# Patient Record
Sex: Female | Born: 1990 | Race: White | Hispanic: No | Marital: Single | Smoking: Never smoker
Health system: Southern US, Community
[De-identification: ages and names within clinical notes are randomized; demographics above are authoritative.]

## PROBLEM LIST (undated history)

## (undated) ENCOUNTER — Inpatient Hospital Stay (HOSPITAL_COMMUNITY): Payer: Self-pay

## (undated) DIAGNOSIS — Z789 Other specified health status: Secondary | ICD-10-CM

## (undated) HISTORY — PX: CHOLECYSTECTOMY: SHX55

## (undated) SURGERY — Surgical Case
Anesthesia: *Unknown

---

## 2011-07-17 ENCOUNTER — Encounter (HOSPITAL_COMMUNITY): Payer: Self-pay | Admitting: *Deleted

## 2011-07-17 ENCOUNTER — Inpatient Hospital Stay (HOSPITAL_COMMUNITY): Payer: Medicaid Other

## 2011-07-17 ENCOUNTER — Inpatient Hospital Stay (HOSPITAL_COMMUNITY)
Admission: AD | Admit: 2011-07-17 | Discharge: 2011-07-17 | Disposition: A | Discharge status: Home / Self Care | Payer: Medicaid Other | Source: Ambulatory Visit | Attending: Obstetrics & Gynecology | Admitting: Obstetrics & Gynecology

## 2011-07-17 DIAGNOSIS — N390 Urinary tract infection, site not specified: Secondary | ICD-10-CM | POA: Insufficient documentation

## 2011-07-17 DIAGNOSIS — R109 Unspecified abdominal pain: Secondary | ICD-10-CM | POA: Insufficient documentation

## 2011-07-17 DIAGNOSIS — Z30431 Encounter for routine checking of intrauterine contraceptive device: Secondary | ICD-10-CM | POA: Insufficient documentation

## 2011-07-17 LAB — WET PREP, GENITAL: Yeast Wet Prep HPF POC: NONE SEEN

## 2011-07-17 LAB — URINALYSIS, ROUTINE W REFLEX MICROSCOPIC
Nitrite: POSITIVE — AB
Specific Gravity, Urine: 1.015 (ref 1.005–1.030)
pH: 6.5 (ref 5.0–8.0)

## 2011-07-17 LAB — URINE MICROSCOPIC-ADD ON

## 2011-07-17 MED ORDER — KETOROLAC TROMETHAMINE 60 MG/2ML IM SOLN
60.0000 mg | Freq: Once | INTRAMUSCULAR | Status: AC
  Administered 2011-07-17: 60 mg via INTRAMUSCULAR
  Filled 2011-07-17: qty 2

## 2011-07-17 MED ORDER — SULFAMETHOXAZOLE-TRIMETHOPRIM 800-160 MG PO TABS: 1.0000 | ORAL_TABLET | Freq: Two times a day (BID) | ORAL | Status: AC

## 2011-07-17 NOTE — Progress Notes (Signed)
Patient states she had an IUD placed after the birth of her daughter 2 years ago. Patient states that with intercourse 4 days ago had sharp pain and bleeding for one day and the pain has continued. Patient states she went to [facility] 2 days ago and wanted the IUD removed and was informed by them they could not remove it. Pain continues and has had a little spotting. Is requesting to have the IUD removed. Has never been able to feel the string.

## 2011-07-17 NOTE — ED Provider Notes (Signed)
History     Chief Complaint  Patient presents with  . Pelvic Pain   HPI Rachel Chung 20 y.o. comes in with abdominal pain which started 4 days ago after sex.  Thinks it is her IUD causing the pain and wants it removed.  Went to [facility] yesterday and had blood work and an ultrasound.  She was referred to [facility] for follow up but she could not afford the up front office fees to be seen.  So came to [facility].  Mirena IUD inserted 2 years ago in Dr. Paralee Cancel office postpartum.   OB History    Grav Para Term Preterm Abortions TAB SAB Ect Mult Living   1 1 1  0 0 0 0 0 0 1      History reviewed. No pertinent past medical history.  Past Surgical History  Procedure Date  . Cesarean section     No family history on file.  History  Substance Use Topics  . Smoking status: Never Smoker   . Smokeless tobacco: Never Used  . Alcohol Use: Yes     x 4 a year    Allergies: Allergies not on file  No prescriptions prior to admission    Review of Systems  Gastrointestinal: Positive for abdominal pain.   Physical Exam   Blood pressure 108/74, pulse 77, temperature 99.3 F (37.4 C), temperature source Oral, resp. rate 16, height 5\' 2"  (1.575 m), weight 193 lb (87.544 kg), SpO2 99.00%.  Physical Exam  Nursing note and vitals reviewed. Constitutional: She is oriented to person, place, and time. She appears well-developed and well-nourished.  HENT:  Head: Normocephalic.  Eyes: EOM are normal.  Neck: Neck supple.  GI: Soft. There is tenderness. There is no rebound and no guarding.       Pain in low midline with palpation Client reports pain radiates to hip bones when it is bad.  Genitourinary:       Speculum exam: Vulva - negative Vagina - Small amount of gold colored discharge, no odor Cervix - No contact bleeding Bimanual exam: Cervix closed, no IUD strings visualized Uterus non tender, normal size Adnexa non tender, no masses bilaterally GC/Chlam, wet prep  done Chaperone present for exam.  Musculoskeletal: Normal range of motion.  Neurological: She is alert and oriented to person, place, and time.  Skin: Skin is warm and dry.  Psychiatric: She has a normal mood and affect.    MAU Course  Procedures Results for orders placed during the hospital encounter of 07/17/11 (from the past 24 hour(s))  URINALYSIS, ROUTINE W REFLEX MICROSCOPIC     Status: Abnormal   Collection Time   07/17/11  6:30 PM      Component Value Range   Color, Urine YELLOW  YELLOW    Appearance CLEAR  CLEAR    Specific Gravity, Urine 1.015  1.005 - 1.030    pH 6.5  5.0 - 8.0    Glucose, UA NEGATIVE  NEGATIVE (mg/dL)   Hgb urine dipstick SMALL (*) NEGATIVE    Bilirubin Urine NEGATIVE  NEGATIVE    Ketones, ur NEGATIVE  NEGATIVE (mg/dL)   Protein, ur NEGATIVE  NEGATIVE (mg/dL)   Urobilinogen, UA 0.2  0.0 - 1.0 (mg/dL)   Nitrite POSITIVE (*) NEGATIVE    Leukocytes, UA SMALL (*) NEGATIVE   URINE MICROSCOPIC-ADD ON     Status: Abnormal   Collection Time   07/17/11  6:30 PM      Component Value Range  Squamous Epithelial / LPF FEW (*) RARE    WBC, UA 21-50  <3 (WBC/hpf)   RBC / HPF 3-6  <3 (RBC/hpf)   Bacteria, UA MANY (*) RARE    Urine-Other MUCOUS PRESENT    WET PREP, GENITAL     Status: Abnormal   Collection Time   07/17/11  6:50 PM      Component Value Range   Yeast, Wet Prep NONE SEEN  NONE SEEN    Trich, Wet Prep NONE SEEN  NONE SEEN    Clue Cells, Wet Prep NONE SEEN  NONE SEEN    WBC, Wet Prep HPF POC FEW (*) NONE SEEN   POCT PREGNANCY, URINE     Status: Normal   Collection Time   07/17/11  7:38 PM      Component Value Range   Preg Test, Ur NEGATIVE      MDM Ultrasound Clinical Data: Evaluate IUD placement.  TRANSABDOMINAL AND TRANSVAGINAL ULTRASOUND OF PELVIS  Technique: Both transabdominal and transvaginal ultrasound  examinations of the pelvis were performed. Transabdominal technique  was performed for global imaging of the pelvis including  uterus,  ovaries, adnexal regions, and pelvic cul-de-sac.  Comparison: None.  It was necessary to proceed with endovaginal exam following the  transabdominal exam to visualize the ovaries and endometrium.  Findings:  Uterus: Measures 8.2 x 3.3 x 4.4 cm. Normal appearance of the  myometrium. No fibroids.  Endometrium: Normal in thickness measuring 4.2 mm. The IUD is in  the endometrial canal.  Right ovary: Measures 3.1 x 2.1 x 2.1 cm. No cysts or masses.  Left ovary: Measures 2.5 x 1.7 x 1.1 cm. No cysts or masses.  Other findings: No free fluid  IMPRESSION:  1. Normal sonographic appearance of the uterus and ovaries.  2. The IUD is in the endometrial canal.  Called and talked with ultrasound tech who confirmed IUD is in correct place in uterus.  Assessment and Plan  UTI  Plan: Will treat with Septra Advised to drink 8 glasses of water daily Advised to get prescription filled and take all as directed Return with worsening symptoms Be seen in the GYN clinic if you want the IUD removed.  BURLESON,TERRI 07/17/2011, 6:53 PM   Nolene Bernheim, NP 07/17/11 1959

## 2011-07-18 LAB — GC/CHLAMYDIA PROBE AMP, GENITAL: Chlamydia, DNA Probe: NEGATIVE

## 2011-08-21 ENCOUNTER — Ambulatory Visit: Payer: Self-pay | Admitting: Advanced Practice Midwife

## 2014-07-11 ENCOUNTER — Encounter (HOSPITAL_COMMUNITY): Payer: Self-pay | Admitting: *Deleted

## 2016-05-24 ENCOUNTER — Inpatient Hospital Stay (HOSPITAL_COMMUNITY)
Admission: AD | Admit: 2016-05-24 | Payer: Self-pay | Source: Other Acute Inpatient Hospital | Admitting: Internal Medicine

## 2016-05-25 ENCOUNTER — Inpatient Hospital Stay (HOSPITAL_COMMUNITY): Payer: Medicaid - Out of State

## 2016-05-25 ENCOUNTER — Encounter (HOSPITAL_COMMUNITY): Payer: Self-pay | Admitting: Family Medicine

## 2016-05-25 ENCOUNTER — Inpatient Hospital Stay (HOSPITAL_COMMUNITY)
Admission: AD | Admit: 2016-05-25 | Discharge: 2016-05-27 | DRG: 445 | Disposition: A | Discharge status: Home / Self Care | Payer: Medicaid - Out of State | Source: Other Acute Inpatient Hospital | Attending: Internal Medicine | Admitting: Internal Medicine

## 2016-05-25 ENCOUNTER — Encounter (HOSPITAL_COMMUNITY)
Admission: AD | Disposition: A | Discharge status: Home / Self Care | Payer: Self-pay | Source: Other Acute Inpatient Hospital | Attending: Internal Medicine

## 2016-05-25 DIAGNOSIS — K8050 Calculus of bile duct without cholangitis or cholecystitis without obstruction: Secondary | ICD-10-CM | POA: Diagnosis not present

## 2016-05-25 DIAGNOSIS — R1011 Right upper quadrant pain: Secondary | ICD-10-CM

## 2016-05-25 DIAGNOSIS — T85528A Displacement of other gastrointestinal prosthetic devices, implants and grafts, initial encounter: Secondary | ICD-10-CM

## 2016-05-25 DIAGNOSIS — K8051 Calculus of bile duct without cholangitis or cholecystitis with obstruction: Secondary | ICD-10-CM | POA: Diagnosis present

## 2016-05-25 DIAGNOSIS — K8020 Calculus of gallbladder without cholecystitis without obstruction: Secondary | ICD-10-CM

## 2016-05-25 DIAGNOSIS — E662 Morbid (severe) obesity with alveolar hypoventilation: Secondary | ICD-10-CM | POA: Diagnosis not present

## 2016-05-25 DIAGNOSIS — Z6841 Body Mass Index (BMI) 40.0 and over, adult: Secondary | ICD-10-CM

## 2016-05-25 DIAGNOSIS — R17 Unspecified jaundice: Secondary | ICD-10-CM | POA: Diagnosis not present

## 2016-05-25 DIAGNOSIS — Z9049 Acquired absence of other specified parts of digestive tract: Secondary | ICD-10-CM

## 2016-05-25 DIAGNOSIS — R109 Unspecified abdominal pain: Secondary | ICD-10-CM

## 2016-05-25 DIAGNOSIS — E669 Obesity, unspecified: Secondary | ICD-10-CM | POA: Diagnosis present

## 2016-05-25 HISTORY — DX: Other specified health status: Z78.9

## 2016-05-25 LAB — CBC WITH DIFFERENTIAL/PLATELET
Basophils Absolute: 0 10*3/uL (ref 0.0–0.1)
Basophils Relative: 0 %
EOS ABS: 0.2 10*3/uL (ref 0.0–0.7)
EOS PCT: 3 %
HCT: 38.5 % (ref 36.0–46.0)
Hemoglobin: 11.9 g/dL — ABNORMAL LOW (ref 12.0–15.0)
LYMPHS ABS: 1.4 10*3/uL (ref 0.7–4.0)
Lymphocytes Relative: 17 %
MCH: 28.5 pg (ref 26.0–34.0)
MCHC: 30.9 g/dL (ref 30.0–36.0)
MCV: 92.1 fL (ref 78.0–100.0)
MONOS PCT: 7 %
Monocytes Absolute: 0.5 10*3/uL (ref 0.1–1.0)
Neutro Abs: 5.9 10*3/uL (ref 1.7–7.7)
Neutrophils Relative %: 73 %
PLATELETS: 306 10*3/uL (ref 150–400)
RBC: 4.18 MIL/uL (ref 3.87–5.11)
RDW: 13.4 % (ref 11.5–15.5)
WBC: 8 10*3/uL (ref 4.0–10.5)

## 2016-05-25 LAB — URINALYSIS, ROUTINE W REFLEX MICROSCOPIC
Glucose, UA: NEGATIVE mg/dL
Hgb urine dipstick: NEGATIVE
Ketones, ur: 40 mg/dL — AB
Leukocytes, UA: NEGATIVE
NITRITE: NEGATIVE
PROTEIN: NEGATIVE mg/dL
SPECIFIC GRAVITY, URINE: 1.017 (ref 1.005–1.030)
pH: 8 (ref 5.0–8.0)

## 2016-05-25 LAB — GLUCOSE, CAPILLARY: GLUCOSE-CAPILLARY: 92 mg/dL (ref 65–99)

## 2016-05-25 LAB — LACTIC ACID, PLASMA
LACTIC ACID, VENOUS: 1.1 mmol/L (ref 0.5–1.9)
LACTIC ACID, VENOUS: 1.4 mmol/L (ref 0.5–1.9)
LACTIC ACID, VENOUS: 0.8 mmol/L (ref 0.5–1.9)

## 2016-05-25 LAB — CBC
HEMATOCRIT: 37.6 % (ref 36.0–46.0)
HEMOGLOBIN: 12.2 g/dL (ref 12.0–15.0)
MCH: 29.3 pg (ref 26.0–34.0)
MCHC: 32.4 g/dL (ref 30.0–36.0)
MCV: 90.2 fL (ref 78.0–100.0)
Platelets: 356 10*3/uL (ref 150–400)
RBC: 4.17 MIL/uL (ref 3.87–5.11)
RDW: 13 % (ref 11.5–15.5)
WBC: 10 10*3/uL (ref 4.0–10.5)

## 2016-05-25 LAB — COMPREHENSIVE METABOLIC PANEL
ALK PHOS: 197 U/L — AB (ref 38–126)
ALT: 505 U/L — ABNORMAL HIGH (ref 14–54)
ANION GAP: 8 (ref 5–15)
AST: 161 U/L — ABNORMAL HIGH (ref 15–41)
Albumin: 3.2 g/dL — ABNORMAL LOW (ref 3.5–5.0)
BUN: 9 mg/dL (ref 6–20)
CALCIUM: 8.6 mg/dL — AB (ref 8.9–10.3)
CHLORIDE: 104 mmol/L (ref 101–111)
CO2: 27 mmol/L (ref 22–32)
Creatinine, Ser: 0.86 mg/dL (ref 0.44–1.00)
Glucose, Bld: 97 mg/dL (ref 65–99)
Potassium: 3.8 mmol/L (ref 3.5–5.1)
SODIUM: 139 mmol/L (ref 135–145)
Total Bilirubin: 1.4 mg/dL — ABNORMAL HIGH (ref 0.3–1.2)
Total Protein: 6.5 g/dL (ref 6.5–8.1)

## 2016-05-25 LAB — LIPASE, BLOOD: LIPASE: 25 U/L (ref 11–51)

## 2016-05-25 LAB — PROTIME-INR
INR: 1.15
Prothrombin Time: 14.8 seconds (ref 11.4–15.2)

## 2016-05-25 SURGERY — ERCP, WITH INTERVENTION IF INDICATED
Anesthesia: Monitor Anesthesia Care

## 2016-05-25 MED ORDER — BISACODYL 5 MG PO TBEC: 5.0000 mg | DELAYED_RELEASE_TABLET | Freq: Every day | ORAL | Status: DC | PRN

## 2016-05-25 MED ORDER — HYDROMORPHONE HCL 1 MG/ML IJ SOLN: 0.5000 mg | INTRAMUSCULAR | Status: DC | PRN

## 2016-05-25 MED ORDER — FAMOTIDINE IN NACL 20-0.9 MG/50ML-% IV SOLN
20.0000 mg | Freq: Two times a day (BID) | INTRAVENOUS | Status: DC
  Administered 2016-05-25 – 2016-05-27 (×5): 20 mg via INTRAVENOUS
  Filled 2016-05-25 (×6): qty 50

## 2016-05-25 MED ORDER — ONDANSETRON HCL 4 MG/2ML IJ SOLN
4.0000 mg | Freq: Four times a day (QID) | INTRAMUSCULAR | Status: DC | PRN
  Administered 2016-05-25 – 2016-05-27 (×5): 4 mg via INTRAVENOUS
  Filled 2016-05-25 (×5): qty 2

## 2016-05-25 MED ORDER — LORAZEPAM 2 MG/ML IJ SOLN
0.5000 mg | Freq: Once | INTRAMUSCULAR | Status: AC
  Administered 2016-05-25: 0.5 mg via INTRAVENOUS
  Filled 2016-05-25: qty 1

## 2016-05-25 MED ORDER — ACETAMINOPHEN 325 MG PO TABS
650.0000 mg | ORAL_TABLET | Freq: Four times a day (QID) | ORAL | Status: DC | PRN
  Administered 2016-05-27: 650 mg via ORAL
  Filled 2016-05-25: qty 2

## 2016-05-25 MED ORDER — ONDANSETRON HCL 4 MG PO TABS: 4.0000 mg | ORAL_TABLET | Freq: Four times a day (QID) | ORAL | Status: DC | PRN

## 2016-05-25 MED ORDER — FENTANYL CITRATE (PF) 100 MCG/2ML IJ SOLN
12.5000 ug | INTRAMUSCULAR | Status: DC | PRN
  Administered 2016-05-25 (×2): 25 ug via INTRAVENOUS
  Administered 2016-05-25: 12.5 ug via INTRAVENOUS
  Administered 2016-05-25 (×3): 25 ug via INTRAVENOUS
  Filled 2016-05-25 (×6): qty 2

## 2016-05-25 MED ORDER — PIPERACILLIN-TAZOBACTAM 3.375 G IVPB
3.3750 g | Freq: Three times a day (TID) | INTRAVENOUS | Status: DC
  Administered 2016-05-25 – 2016-05-27 (×7): 3.375 g via INTRAVENOUS
  Filled 2016-05-25 (×10): qty 50

## 2016-05-25 MED ORDER — HYDROMORPHONE HCL 1 MG/ML IJ SOLN
0.5000 mg | Freq: Once | INTRAMUSCULAR | Status: AC
  Administered 2016-05-25: 0.5 mg via INTRAVENOUS
  Filled 2016-05-25: qty 1

## 2016-05-25 MED ORDER — SODIUM CHLORIDE 0.9 % IV SOLN
INTRAVENOUS | Status: AC
  Administered 2016-05-25: 02:00:00 via INTRAVENOUS

## 2016-05-25 MED ORDER — HYDROMORPHONE HCL 1 MG/ML IJ SOLN
0.5000 mg | INTRAMUSCULAR | Status: DC | PRN
  Administered 2016-05-25 – 2016-05-26 (×6): 0.5 mg via INTRAVENOUS
  Filled 2016-05-25 (×7): qty 1

## 2016-05-25 MED ORDER — FLEET ENEMA 7-19 GM/118ML RE ENEM: 1.0000 | ENEMA | Freq: Once | RECTAL | Status: DC | PRN

## 2016-05-25 MED ORDER — ACETAMINOPHEN 650 MG RE SUPP: 650.0000 mg | Freq: Four times a day (QID) | RECTAL | Status: DC | PRN

## 2016-05-25 MED ORDER — HEPARIN SODIUM (PORCINE) 5000 UNIT/ML IJ SOLN
5000.0000 [IU] | Freq: Three times a day (TID) | INTRAMUSCULAR | Status: DC
  Administered 2016-05-25 – 2016-05-27 (×4): 5000 [IU] via SUBCUTANEOUS
  Filled 2016-05-25 (×5): qty 1

## 2016-05-25 MED ORDER — POLYETHYLENE GLYCOL 3350 17 G PO PACK: 17.0000 g | PACK | Freq: Every day | ORAL | Status: DC | PRN

## 2016-05-25 NOTE — Progress Notes (Addendum)
Re-evaluated patient due to uncontrolled abd pain Pt states Fentanyl 25 mg IV q 2 hours--"doesn't work at all" .  States she got some "D medicine" at [facility] that helped.  No vomiting. No respiratory distress.  T97.6--HR 94--RR 18--133/75 CV--RRR Lung--no wheeze, diminished at bases Abd--soft, ND,+BS, no rebound, No peritoneal signs  Pain seems out of proportion with physical exam.  Dilaudid 0.5 mg IV q 2 hours prn severe pain ordered  30 min after dilaudid given--"when is this medicine going to work"--still c/o severe pain  Check 2 view AXR; ativan 0.5 mg IV x 1 CBC and lactic acid  DTat

## 2016-05-25 NOTE — H&P (Signed)
History and Physical    Rachel GambleJennifer Chung ONG:[id number] DOB: 10/20/1990 DOA: 05/25/2016  PCP: No PCP Per Patient   [facility]   Chief Complaint: Severe RUQ pain despite cholecystectomy   HPI/[facility] Course: Rachel GambleJennifer Chung is a 25 y.o. female with medical history significant for chronic constipation who developed mild and intermittent right upper quadrant and epigastric pain for the past 1-2 months, experienced acute worsening on 05/22/2016, sought care [facility], was found to have acute cholecystitis, and underwent laparoscopic cholecystectomy that same day. Patient was managed with Zosyn, analgesics, and anti-emetics. She continued to have severe persistent pain in the right upper quadrant following the cholecystectomy and nuclear medicine hepatobiliary imaging study was performed and suggestive of a possible partial common bile duct obstruction. Her total bilirubin was noted to rise from the normal range up to 2.0 on 05/24/2016 and further evaluation was performed with MRCP. MRCP findings suggest a 3-4 mm filling defect in the distal CBD which measures up to 6 mm, as well as mild peripancreatic and perisplenic ascites. [facility] was reportedly contacted by the [facility] attending and consultation was requested to evaluate for possible ERCP. Transfer [facility] was arranged, and the patient was accepted to the medicine service.  Patient has been interviewed and examined [facility] medical surgical [facility] where she continues to be afebrile and with vital signs stable. She appears nontoxic, but uncomfortable secondary to right upper quadrant abdominal pain. She will be admitted for ongoing evaluation and management of this pain, which is suspected to be secondary to choledocholithiasis.  Review of Systems:  All other systems reviewed and apart from HPI, are negative.  Past Medical History:  Diagnosis Date  .  Medical history non-contributory     Past Surgical History:  Procedure Laterality Date  . CESAREAN SECTION    . CHOLECYSTECTOMY       reports that she has never smoked. She has never used smokeless tobacco. She reports that she drinks alcohol. She reports that she does not use drugs.  No Known Allergies  Family History  Problem Relation Age of Onset  . Sudden Cardiac Death Neg Hx      Prior to Admission medications   Not on File    Physical Exam: Vitals:   05/25/16 0118  BP: (!) 133/93  Pulse: 83  Resp: 18  Temp: 98.4 F (36.9 C)  TempSrc: Oral  SpO2: 97%      Constitutional: NAD, calm, in apparent discomfort Eyes: PERTLA, lids and conjunctivae normal ENMT: Mucous membranes are dry. Posterior pharynx clear of any exudate or lesions.   Neck: normal, supple, no masses, no thyromegaly Respiratory: clear to auscultation bilaterally, no wheezing, no crackles. Normal respiratory effort.   Cardiovascular: S1 & S2 heard, regular rate and rhythm, no significant murmur. No extremity edema. 2+ pedal pulses. No significant JVD. Abdomen: No distension, tender in RUQ with mild rebound pain but no guarding, no masses palpated. Bowel sounds normal.  Musculoskeletal: no clubbing / cyanosis. No joint deformity upper and lower extremities. Normal muscle tone.  Skin: no significant rashes, lesions, ulcers. Warm, dry, well-perfused. Neurologic: CN 2-12 grossly intact. Sensation intact, DTR normal. Strength 5/5 in all 4 limbs.  Psychiatric: Normal judgment and insight. Alert and oriented x 3. Normal mood and affect.     Labs on Admission: I have personally reviewed following labs and imaging studies  CBC: No results for input(s): WBC, NEUTROABS, HGB, HCT, MCV, PLT  in the last 168 hours. Basic Metabolic Panel: No results for input(s): NA, K, CL, CO2, GLUCOSE, BUN, CREATININE, CALCIUM, MG, PHOS in the last 168 hours. GFR: CrCl cannot be calculated (Unknown ideal weight.). Liver  Function Tests: No results for input(s): AST, ALT, ALKPHOS, BILITOT, PROT, ALBUMIN in the last 168 hours. No results for input(s): LIPASE, AMYLASE in the last 168 hours. No results for input(s): AMMONIA in the last 168 hours. Coagulation Profile: No results for input(s): INR, PROTIME in the last 168 hours. Cardiac Enzymes: No results for input(s): CKTOTAL, CKMB, CKMBINDEX, TROPONINI in the last 168 hours. BNP (last 3 results) No results for input(s): PROBNP in the last 8760 hours. HbA1C: No results for input(s): HGBA1C in the last 72 hours. CBG: No results for input(s): GLUCAP in the last 168 hours. Lipid Profile: No results for input(s): CHOL, HDL, LDLCALC, TRIG, CHOLHDL, LDLDIRECT in the last 72 hours. Thyroid Function Tests: No results for input(s): TSH, T4TOTAL, FREET4, T3FREE, THYROIDAB in the last 72 hours. Anemia Panel: No results for input(s): VITAMINB12, FOLATE, FERRITIN, TIBC, IRON, RETICCTPCT in the last 72 hours. Urine analysis:    Component Value Date/Time   COLORURINE YELLOW 07/17/2011 1830   APPEARANCEUR CLEAR 07/17/2011 1830   LABSPEC 1.015 07/17/2011 1830   PHURINE 6.5 07/17/2011 1830   GLUCOSEU NEGATIVE 07/17/2011 1830   HGBUR SMALL (A) 07/17/2011 1830   BILIRUBINUR NEGATIVE 07/17/2011 1830   KETONESUR NEGATIVE 07/17/2011 1830   PROTEINUR NEGATIVE 07/17/2011 1830   UROBILINOGEN 0.2 07/17/2011 1830   NITRITE POSITIVE (A) 07/17/2011 1830   LEUKOCYTESUR SMALL (A) 07/17/2011 1830   Sepsis Labs: @LABRCNTIP (procalcitonin:4,lacticidven:4) )No results found for this or any previous visit (from the past 240 hour(s)).   Radiological Exams on Admission: No results found.  EKG: Ordered and pending  Assessment/Plan  1. Choledocholithiasis  - Pt has had persistent severe RUQ pain following laparoscopic cholecystectomy on 9/13 and t bili was noted to rise - Nuc med scan suggested a partial CBD obstruction on 9/14 and MRCP was performed on 9/15 with a 3-4 mm filling  defect in distal CBD - [facility] was consulted for possible ERCP and pt has been transferred here to [facility] - Check labs here now; continue empiric Zosyn for now, IVF hydration, incentive-spirometry, VTE ppx, symptom-management with prn analgesia and antiemetics  2. Acute cholecystitis s/p laparoscopic cholecystectomy POD #3 - Pt presented to [facility] on 9/13 with worsening RUQ pain, was found to have acute cholecystitis and underwent lap chole that day  - Symptomatic care as above    DVT prophylaxis: sq heparin  Code Status: Full  Family Communication: Fiance updated at bedside Disposition Plan: Admit to med-surg  Consults called: [facility]   Admission status: Inpatient    Briscoe Deutscher, MD [facility] Pager [phone]  If 7PM-7AM, please contact night-coverage [email or web address] Password [email or web address]  05/25/2016, 2:02 AM
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
## 2016-05-25 NOTE — Consult Note (Addendum)
Referring Provider: Dr. Minerva Areola Primary Care Physician:  No PCP Per Patient Primary Gastroenterologist:  none  Reason for Consultation:  CBD stone.   HPI: Rachel Chung is a 25 y.o. female transfer to [facility] from [facility] for further evaluation. Patient underwent cholecystectomy 2 days ago for right upper quadrant pain of one to 2 months duration. Patient continues to have severe  right upper quadrant pain after cholecystectomy. For further evaluation, HIDA scan was performed which showed no CBD or bowel activity. Because of ongoing pain MRI-MRCP was performed which showed 3-4 mm CBD stone. CBD of 6 mm. Patient is afebrile. Denied any confusion. Complaining of ongoing right upper quadrant pain. Complaining of nausea denied any vomiting. No prior endoscopy.   Past Medical History:  Diagnosis Date  . Medical history non-contributory     Past Surgical History:  Procedure Laterality Date  . CESAREAN SECTION    . CHOLECYSTECTOMY      Prior to Admission medications   Not on File    Scheduled Meds: . famotidine (PEPCID) IV  20 mg Intravenous Q12H  . heparin  5,000 Units Subcutaneous Q8H  . piperacillin-tazobactam (ZOSYN)  IV  3.375 g Intravenous Q8H   Continuous Infusions: . sodium chloride 100 mL/hr at 05/25/16 0218   PRN Meds:.acetaminophen **OR** acetaminophen, bisacodyl, fentaNYL (SUBLIMAZE) injection, ondansetron **OR** ondansetron (ZOFRAN) IV, polyethylene glycol, sodium phosphate  Allergies as of 05/24/2016  . (No Known Allergies)    Family History  Problem Relation Age of Onset  . Sudden Cardiac Death Neg Hx     Social History   Social History  . Marital status: Single    Spouse name: N/A  . Number of children: N/A  . Years of education: N/A   Occupational History  . Not on file.   Social History Main Topics  . Smoking status: Never Smoker  . Smokeless tobacco: Never Used  . Alcohol use Yes     Comment: x 4 a year  . Drug use:  No  . Sexual activity: Yes    Birth control/ protection: IUD   Other Topics Concern  . Not on file   Social History Narrative  . No narrative on file    Review of Systems: All negative except as stated above in HPI.  Physical Exam: Vital signs: Vitals:   05/25/16 0118 05/25/16 0513  BP: (!) 133/93 110/74  Pulse: 83 (!) 120  Resp: 18 17  Temp: 98.4 F (36.9 C) 99.4 F (37.4 C)   Last BM Date: 05/24/16 General:   Alert,  Well-developed, well-nourished, pleasant and cooperative in NAD Lungs:  Clear throughout to auscultation.   No wheezes, crackles, or rhonchi. No acute distress. Heart:  Regular rate and rhythm; no murmurs, clicks, rubs,  or gallops. Abdomen:  Soft, RUQ TTP with generalized abdominal discomfort. No peritoneal signs present at this time. BS +  LE: no edema   GI:  Lab Results:  Recent Labs  05/25/16 0320  WBC 8.0  HGB 11.9*  HCT 38.5  PLT 306   BMET  Recent Labs  05/25/16 0320  NA 139  K 3.8  CL 104  CO2 27  GLUCOSE 97  BUN 9  CREATININE 0.86  CALCIUM 8.6*   LFT  Recent Labs  05/25/16 0320  PROT 6.5  ALBUMIN 3.2*  AST 161*  ALT 505*  ALKPHOS 197*  BILITOT 1.4*   PT/INR  Recent Labs  05/25/16 0320  LABPROT 14.8  INR 1.15  Studies/Results: No results found.  Impression/Plan: Choledocholithiasis with MRI confirming CBD stone. Status post laparoscopic cholecystectomy 2 days ago CT scan showing 3.1 x 1.8 cm fluid collection in RUQ. Could be changes associated with recent surgery. ?/ Bile leak.   Recommendations ------------------------ - Patient is afebrile. Normal white count. Elevated AST/ALT noted. Normal total bilirubin - Possible ERCP today. Risk and benefits of the procedure discussed with the patient including but not limited to infection, bleeding, perforation and post-ERCP pancreatitis. Patient verbalized understanding. Agreeable to proceed. Continue Zosyn Monitor  LFTs GI will follow  ADDENDUM  : ---------------- - Patient was reevaluated. Abdominal pain is improving. plan for ERCP tomorrow(not today.) D/W Nursing staff.  - ok to have sips of water. NPO PM    LOS: 0 days   Joanie Duprey  05/25/2016, 8:01 AM  Pager [phone] If no answer or after 5 PM call [phone]

## 2016-05-25 NOTE — Progress Notes (Addendum)
Reevaluated pt at 1815--denies v/d, sob.  C/o abd pain Parents updated at bedside  WBC 10.0; Hgb 12.2 Lactate 0.8  VS--T98.3--HR 93--RR 20--128/78--95% on RA CV-RRR Lung--no wheeze, diminished at bases Abd--soft, ND, +BS, no rebound, pain out of proportion with exam findings  Awaiting AXR and lactate Dilaudid 0.5 mg x 1 dose extra Continue IVF  DTat

## 2016-05-26 ENCOUNTER — Inpatient Hospital Stay (HOSPITAL_COMMUNITY): Payer: Medicaid - Out of State

## 2016-05-26 ENCOUNTER — Encounter (HOSPITAL_COMMUNITY): Payer: Self-pay

## 2016-05-26 ENCOUNTER — Encounter (HOSPITAL_COMMUNITY)
Admission: AD | Disposition: A | Discharge status: Home / Self Care | Payer: Self-pay | Source: Other Acute Inpatient Hospital | Attending: Internal Medicine

## 2016-05-26 ENCOUNTER — Inpatient Hospital Stay (HOSPITAL_COMMUNITY): Payer: Medicaid - Out of State | Admitting: Anesthesiology

## 2016-05-26 HISTORY — PX: ERCP: SHX5425

## 2016-05-26 LAB — CBC
HEMATOCRIT: 37.1 % (ref 36.0–46.0)
HEMOGLOBIN: 12 g/dL (ref 12.0–15.0)
MCH: 28.8 pg (ref 26.0–34.0)
MCHC: 32.3 g/dL (ref 30.0–36.0)
MCV: 89.2 fL (ref 78.0–100.0)
PLATELETS: 332 10*3/uL (ref 150–400)
RBC: 4.16 MIL/uL (ref 3.87–5.11)
RDW: 13 % (ref 11.5–15.5)
WBC: 9.7 10*3/uL (ref 4.0–10.5)

## 2016-05-26 LAB — COMPREHENSIVE METABOLIC PANEL
ALK PHOS: 209 U/L — AB (ref 38–126)
ALT: 390 U/L — ABNORMAL HIGH (ref 14–54)
ANION GAP: 12 (ref 5–15)
AST: 116 U/L — ABNORMAL HIGH (ref 15–41)
Albumin: 3 g/dL — ABNORMAL LOW (ref 3.5–5.0)
BILIRUBIN TOTAL: 1.8 mg/dL — AB (ref 0.3–1.2)
BUN: 9 mg/dL (ref 6–20)
CALCIUM: 8.8 mg/dL — AB (ref 8.9–10.3)
CO2: 22 mmol/L (ref 22–32)
CREATININE: 0.79 mg/dL (ref 0.44–1.00)
Chloride: 101 mmol/L (ref 101–111)
GFR calc non Af Amer: >60 mL/min (ref >60)
Glucose, Bld: 88 mg/dL (ref 65–99)
Potassium: 3.8 mmol/L (ref 3.5–5.1)
Sodium: 135 mmol/L (ref 135–145)
TOTAL PROTEIN: 6.9 g/dL (ref 6.5–8.1)

## 2016-05-26 LAB — GLUCOSE, CAPILLARY: Glucose-Capillary: 84 mg/dL (ref 65–99)

## 2016-05-26 SURGERY — ERCP, WITH INTERVENTION IF INDICATED
Anesthesia: General

## 2016-05-26 MED ORDER — SUCCINYLCHOLINE CHLORIDE 20 MG/ML IJ SOLN
INTRAMUSCULAR | Status: DC | PRN
  Administered 2016-05-26: 120 mg via INTRAVENOUS

## 2016-05-26 MED ORDER — LACTATED RINGERS IV SOLN
INTRAVENOUS | Status: DC | PRN
  Administered 2016-05-26: 12:00:00 via INTRAVENOUS

## 2016-05-26 MED ORDER — PHENYLEPHRINE HCL 10 MG/ML IJ SOLN
INTRAVENOUS | Status: DC | PRN
  Administered 2016-05-26: 50 ug/min via INTRAVENOUS

## 2016-05-26 MED ORDER — ARTIFICIAL TEARS OP OINT
TOPICAL_OINTMENT | OPHTHALMIC | Status: DC | PRN
  Administered 2016-05-26: 1 via OPHTHALMIC

## 2016-05-26 MED ORDER — INDOMETHACIN 50 MG RE SUPP
RECTAL | Status: AC
  Filled 2016-05-26: qty 2

## 2016-05-26 MED ORDER — GLUCAGON HCL RDNA (DIAGNOSTIC) 1 MG IJ SOLR
INTRAMUSCULAR | Status: AC
  Filled 2016-05-26: qty 1

## 2016-05-26 MED ORDER — MIDAZOLAM HCL 5 MG/5ML IJ SOLN
INTRAMUSCULAR | Status: DC | PRN
  Administered 2016-05-26: 1 mg via INTRAVENOUS

## 2016-05-26 MED ORDER — FENTANYL CITRATE (PF) 100 MCG/2ML IJ SOLN
INTRAMUSCULAR | Status: DC | PRN
  Administered 2016-05-26: 100 ug via INTRAVENOUS

## 2016-05-26 MED ORDER — MORPHINE SULFATE (PF) 2 MG/ML IV SOLN
1.0000 mg | Freq: Once | INTRAVENOUS | Status: AC
  Administered 2016-05-26: 1 mg via INTRAVENOUS
  Filled 2016-05-26: qty 1

## 2016-05-26 MED ORDER — HYDROMORPHONE HCL 1 MG/ML IJ SOLN
0.5000 mg | Freq: Once | INTRAMUSCULAR | Status: AC
Start: 2016-05-26 — End: 2016-05-26
  Administered 2016-05-26: 0.5 mg via INTRAVENOUS

## 2016-05-26 MED ORDER — ONDANSETRON HCL 4 MG/2ML IJ SOLN: 4.0000 mg | Freq: Once | INTRAMUSCULAR | Status: DC | PRN

## 2016-05-26 MED ORDER — FENTANYL CITRATE (PF) 100 MCG/2ML IJ SOLN: 25.0000 ug | INTRAMUSCULAR | Status: DC | PRN

## 2016-05-26 MED ORDER — LIDOCAINE HCL (CARDIAC) 20 MG/ML IV SOLN
INTRAVENOUS | Status: DC | PRN
  Administered 2016-05-26: 100 mg via INTRAVENOUS

## 2016-05-26 MED ORDER — IOPAMIDOL (ISOVUE-300) INJECTION 61%
INTRAVENOUS | Status: AC
  Filled 2016-05-26: qty 50

## 2016-05-26 MED ORDER — SODIUM CHLORIDE 0.9 % IV SOLN
INTRAVENOUS | Status: DC | PRN
  Administered 2016-05-26: 45 mL

## 2016-05-26 MED ORDER — ALBUMIN HUMAN 5 % IV SOLN
INTRAVENOUS | Status: DC | PRN
  Administered 2016-05-26: 12:00:00 via INTRAVENOUS

## 2016-05-26 MED ORDER — PROPOFOL 10 MG/ML IV BOLUS
INTRAVENOUS | Status: DC | PRN
  Administered 2016-05-26: 200 mg via INTRAVENOUS

## 2016-05-26 MED ORDER — HYDROCODONE-ACETAMINOPHEN 5-325 MG PO TABS
1.0000 | ORAL_TABLET | ORAL | Status: DC | PRN
  Administered 2016-05-26 – 2016-05-27 (×2): 1 via ORAL
  Filled 2016-05-26 (×2): qty 1

## 2016-05-26 MED ORDER — INDOMETHACIN 50 MG RE SUPP
RECTAL | Status: DC | PRN
  Administered 2016-05-26: 100 mg via RECTAL

## 2016-05-26 NOTE — Progress Notes (Signed)
Notified Dr. Selena BattenKim that pt states her pain is so bad to right abdomen that it is making the rt side of her chest tight. MD gave order for Morphine 1mg  IV x1. Will continue to monitor pt. Nelda MarseilleJenny Thacker, RN

## 2016-05-26 NOTE — Progress Notes (Addendum)
[facility] Progress [facility]  Rachel GambleJennifer Chung 25 y.o. 11/22/1990  Chief complaint. Follow-up for abdominal pain. CBD stone. Subjective:  Patient is complaining of significant right upper quadrant abdominal pain with radiation to right chest. Afebrile. X-ray was performed yesterday which did not show any free air.  Objective: Vital signs in last 24 hours: Vitals:   05/25/16 2103 05/26/16 0527  BP: 132/75 121/77  Pulse: 99 99  Resp: 19 19  Temp: 98.8 F (37.1 C) 97.8 F (36.6 C)    Physical Exam:  General:  Alert, In mild distress from abdominal pain   Head:  Normocephalic, without obvious abnormality, atraumatic  Eyes:  , EOM's intact,   Lungs:   Clear to auscultation bilaterally, respirations unlabored  Heart:  Regular rate and rhythm, S1, S2 normal  Abdomen:   Right upper quadrant tenderness to palpation with generalized abdominal tenderness to palpation Abdomen is soft. No rebound tenderness.   Extremities: Extremities normal, atraumatic, no  edema       Lab Results:  Recent Labs  05/25/16 0320 05/26/16 0558  NA 139 135  K 3.8 3.8  CL 104 101  CO2 27 22  GLUCOSE 97 88  BUN 9 9  CREATININE 0.86 0.79  CALCIUM 8.6* 8.8*    Recent Labs  05/25/16 0320 05/26/16 0558  AST 161* 116*  ALT 505* 390*  ALKPHOS 197* 209*  BILITOT 1.4* 1.8*  PROT 6.5 6.9  ALBUMIN 3.2* 3.0*    Recent Labs  05/25/16 0320 05/25/16 1755 05/26/16 0558  WBC 8.0 10.0 9.7  NEUTROABS 5.9  --   --   HGB 11.9* 12.2 12.0  HCT 38.5 37.6 37.1  MCV 92.1 90.2 89.2  PLT 306 356 332    Recent Labs  05/25/16 0320  LABPROT 14.8  INR 1.15      Assessment/Plan: - CBD stone confirmed with MRI-MRCP - Worsening right upper quadrant abdominal pain ? CBD obstruction. Marland Kitchen. LFTs elevated but remained stable.  X-ray was performed yesterday which did not show any free air.  - Status post laparoscopic cholecystectomy CT scan showing 3.1 x 1.8 cm fluid collection in RUQ. Could be  changes associated with recent surgery. ?/ Bile leak.   Recommendations ------------------------  ERCP today. Risk and benefits of the procedure discussed with the patient including but not limited to infection, bleeding, perforation and post-ERCP pancreatitis. Patient verbalized understanding. Agreeable to proceed. Continue Zosyn Monitor  LFTs GI will follow   Kathi DerParag Rachel Szymborski MD, FACP  05/26/2016, 9:48 AM  Pager [phone]  If no answer or after 5 PM call [phone]

## 2016-05-26 NOTE — Transfer of Care (Signed)
Immediate Anesthesia Transfer of Care Note  Patient: Rachel GambleJennifer Chung  Procedure(s) Performed: Procedure(s): ENDOSCOPIC RETROGRADE CHOLANGIOPANCREATOGRAPHY (ERCP) (N/A)  Patient Location: PACU  Anesthesia Type:General  Level of Consciousness: awake, alert  and oriented  Airway & Oxygen Therapy: Patient Spontanous Breathing and Patient connected to nasal cannula oxygen  Post-op Assessment: Report given to RN, Post -op Vital signs reviewed and stable and Patient moving all extremities  Post vital signs: Reviewed and stable  Last Vitals:  Vitals:   05/26/16 1132 05/26/16 1301  BP: 132/87 (P) 125/84  Pulse: (!) 128 (!) (P) 131  Resp: (!) 24 (!) (P) 29  Temp:  (P) 37.3 C    Last Pain:  Vitals:   05/26/16 1132  TempSrc:   PainSc: 10-Worst pain ever      Patients Stated Pain Goal: 5 (05/26/16 0518)  Complications: No apparent anesthesia complications

## 2016-05-26 NOTE — Anesthesia Procedure Notes (Signed)
Procedure Name: Intubation Date/Time: 05/26/2016 12:03 PM Performed by: Edmonia CaprioAUSTON, Jaydin Boniface M Pre-anesthesia Checklist: Patient identified, Emergency Drugs available, Suction available, Timeout performed and Patient being monitored Patient Re-evaluated:Patient Re-evaluated prior to inductionOxygen Delivery Method: Circle system utilized Preoxygenation: Pre-oxygenation with 100% oxygen Intubation Type: IV induction and Rapid sequence Ventilation: Mask ventilation without difficulty Laryngoscope Size: Miller and 2 Grade View: Grade I Tube type: Oral Laser Tube: Cuffed inflated with minimal occlusive pressure - saline Tube size: 7.5 mm Airway Equipment and Method: Stylet Placement Confirmation: ETT inserted through vocal cords under direct vision,  positive ETCO2 and breath sounds checked- equal and bilateral Secured at: 23 cm Tube secured with: Tape Dental Injury: Teeth and Oropharynx as per pre-operative assessment

## 2016-05-26 NOTE — Anesthesia Postprocedure Evaluation (Signed)
Anesthesia Post Note  Patient: Augusto GambleJennifer Roehm  Procedure(s) Performed: Procedure(s) (LRB): ENDOSCOPIC RETROGRADE CHOLANGIOPANCREATOGRAPHY (ERCP) (N/A)  Patient location during evaluation: PACU Anesthesia Type: General Level of consciousness: awake and alert Pain management: pain level controlled Vital Signs Assessment: post-procedure vital signs reviewed and stable Respiratory status: spontaneous breathing, nonlabored ventilation, respiratory function stable and patient connected to nasal cannula oxygen Cardiovascular status: blood pressure returned to baseline and stable Postop Assessment: no signs of nausea or vomiting Anesthetic complications: no    Last Vitals:  Vitals:   05/26/16 1345 05/26/16 1417  BP: 133/89 111/70  Pulse:  (!) 112  Resp:  18  Temp: 37 C 36.9 C    Last Pain:  Vitals:   05/26/16 1417  TempSrc: Oral  PainSc:                  Cecile HearingStephen Edward Bernice Mullin

## 2016-05-26 NOTE — Progress Notes (Signed)
Rachel GambleJennifer Chung 11:47 AM  Subjective: Patient seen and examined and case discussed with my partner as well as the surgeon in[place] Friday and the case discussed with her fianc as well and she has no new complaints but is still having pain and since I talked to the surgeons she say  she had a MRCP which did show a stone   Objective: Vital signs stable afebrile no acute distress exam please see preassessment evaluation labs reviewed regular x-ray reviewed  Assessment: Probable CBD stone  Plan: Okay to proceed with ERCP with anesthesia assistance  [facility],Rachel Chung E  Pager [phone] After 5PM or if no answer call [phone]

## 2016-05-26 NOTE — Op Note (Signed)
[facility] Patient Name: Rachel Chung Procedure Date : 05/26/2016 MRN: [id number] Attending MD: Vida Rigger , MD Date of Birth: 05/19/1991 CSN: [id number] Age: 25 Admit Type: Inpatient Procedure:                ERCP Indications:              Bile duct stone(s) Providers:                Vida Rigger, MD, Waynard Edwards RN, RN, Will Bonnet RN, RN, Kandice Robinsons, Technician Referring MD:              Medicines:                General Anesthesia Complications:            No immediate complications. Estimated Blood Loss:     Estimated blood loss: none. Procedure:                Pre-Anesthesia Assessment:                           - Prior to the procedure, a History and Physical                            was performed, and patient medications and                            allergies were reviewed. The patient's tolerance of                            previous anesthesia was also reviewed. The risks                            and benefits of the procedure and the sedation                            options and risks were discussed with the patient.                            All questions were answered, and informed consent                            was obtained. Prior Anticoagulants: The patient has                            taken heparin, last dose was day of procedure. ASA                            Grade Assessment: II - A patient with mild systemic                            disease. After reviewing the risks and benefits,  the patient was deemed in satisfactory condition to                            undergo the procedure.                           After obtaining informed consent, the scope was                            passed under direct vision. Throughout the                            procedure, the patient's blood pressure, pulse, and                            oxygen saturations were monitored  continuously. The                            [id number]) scope was introduced through                            the mouth, and used to inject contrast into and                            used to locate the major papilla. The ERCP was                            accomplished without difficulty. The patient                            tolerated the procedure well. Findings:      The major papilla was normal. One 4 Fr by 5 cm plastic stent with a       single internal pigtail was placed into the ventral pancreatic duct       after the first 2 wires were advanced towards the pancreas and our       attempt to cannulate the CBD. The stent was in good position.we then       were able to get deep selective cannulation of the CBD and a small stone       was seen and we proceeded with a Biliary sphincterotomy was made with a       Hydratome sphincterotome using ERBE electrocautery. There was no       post-sphincterotomy bleeding. Choledocholithiasis was found in a       nondilated duct. The biliary tree was swept with a 9 mm -12mm adjustable       balloon starting at the bifurcation. One stone was removed. No stones       remained. Nothing was found. both the 12 mm and9 mm balloons passed       readily through the patent sphincterotomy site and we proceeded with an       occlusion cholangiogram at the end which was normal and there was       adequate biliary drainage and the wire and the balloon were removed and       the scope was removed and the patient tolerated the  procedure well there       was no obvious complication at one point under fluoroscopy we thought       possibly there was a very small bile leak based on some pooling around       the clips of contrast but difficult to tell and hopefully the       sphincterotomy should treat that adequately regardless Impression:               - The major papilla appeared normal.                           - Choledocholithiasis was found.  Complete removal                            was accomplished by biliary sphincterotomy and                            balloon extraction.                           - One plastic stent was placed into the ventral                            pancreatic duct to aide with cannulation and                            decreased risk of pancreatitis.                           - A biliary sphincterotomy was performed.                           - The biliary tree was swept and nothing was found.                            possibly tiny bile leak as above Moderate Sedation:      moderate sedation-none Recommendation:           - Avoid aspirin and nonsteroidal anti-inflammatory                            medicines for 5 days.                           - Clear liquid diet today.                           - Continue present medications.                           - Return to GI clinic PRN. consider HIDA scan and                            CBD stenting if patient fails to improve with this  procedure                           - Telephone GI clinic if symptomatic PRN. follow                            liver tests back to normal over the next week                           - Perform a flat plate and upright abdominal x-ray                            in 5 days to ensure pancreatic stent passes. Procedure Code(s):        --- Professional ---                           [phone], Esophagogastroduodenoscopy, flexible,                            transoral; diagnostic, including collection of                            specimen(s) by brushing or washing, when performed                            (separate procedure) Diagnosis Code(s):        --- Professional ---                           K80.50, Calculus of bile duct without cholangitis                            or cholecystitis without obstruction CPT copyright 2016 American Medical Association. All rights reserved. The codes documented  in this report are preliminary and upon coder review may  be revised to meet current compliance requirements. Vida Rigger, MD 05/26/2016 12:54:44 PM This report has been signed electronically. Number of Addenda: 0

## 2016-05-26 NOTE — Anesthesia Preprocedure Evaluation (Addendum)
Anesthesia Evaluation  Patient identified by MRN, date of birth, ID band Patient awake    Reviewed: Allergy & Precautions, NPO status , Patient's Chart, lab work & pertinent test results  Airway Mallampati: II  TM Distance: >3 FB Neck ROM: Full    Dental  (+) Teeth Intact, Dental Advisory Given   Pulmonary neg pulmonary ROS,    Pulmonary exam normal breath sounds clear to auscultation       Cardiovascular negative cardio ROS Normal cardiovascular exam Rhythm:Regular Rate:Normal     Neuro/Psych negative neurological ROS     GI/Hepatic negative GI ROS, S/p cholecystectomy Elevated liver enzymes, RUQ pain, CBD stone   Endo/Other  Morbid obesity  Renal/GU negative Renal ROS     Musculoskeletal negative musculoskeletal ROS (+)   Abdominal   Peds  Hematology negative hematology ROS (+)   Anesthesia Other Findings Day of surgery medications reviewed with the patient.  Patient denies change of pregnancy. Has had several pregnancy tests at prior hospital all negative per patient.  Reproductive/Obstetrics                            Anesthesia Physical Anesthesia Plan  ASA: III  Anesthesia Plan: General   Post-op Pain Management:    Induction: Intravenous and Rapid sequence  Airway Management Planned: Oral ETT  Additional Equipment:   Intra-op Plan:   Post-operative Plan: Extubation in OR  Informed Consent: I have reviewed the patients History and Physical, chart, labs and discussed the procedure including the risks, benefits and alternatives for the proposed anesthesia with the patient or authorized representative who has indicated his/her understanding and acceptance.   Dental advisory given  Plan Discussed with: CRNA  Anesthesia Plan Comments: (Risks/benefits of general anesthesia discussed with patient including risk of damage to teeth, lips, gum, and tongue, nausea/vomiting,  allergic reactions to medications, and the possibility of heart attack, stroke and death.  All patient questions answered.  Patient wishes to proceed.)        Anesthesia Quick Evaluation
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
## 2016-05-27 ENCOUNTER — Encounter (HOSPITAL_COMMUNITY): Payer: Self-pay | Admitting: Gastroenterology

## 2016-05-27 ENCOUNTER — Inpatient Hospital Stay (HOSPITAL_COMMUNITY): Payer: Medicaid - Out of State

## 2016-05-27 LAB — CBC WITH DIFFERENTIAL/PLATELET
BASOS ABS: 0 10*3/uL (ref 0.0–0.1)
Basophils Relative: 1 %
EOS PCT: 5 %
Eosinophils Absolute: 0.3 10*3/uL (ref 0.0–0.7)
HEMATOCRIT: 34.6 % — AB (ref 36.0–46.0)
HEMOGLOBIN: 11 g/dL — AB (ref 12.0–15.0)
LYMPHS ABS: 1.8 10*3/uL (ref 0.7–4.0)
LYMPHS PCT: 27 %
MCH: 28.6 pg (ref 26.0–34.0)
MCHC: 31.8 g/dL (ref 30.0–36.0)
MCV: 89.9 fL (ref 78.0–100.0)
Monocytes Absolute: 0.5 10*3/uL (ref 0.1–1.0)
Monocytes Relative: 7 %
NEUTROS ABS: 3.9 10*3/uL (ref 1.7–7.7)
NEUTROS PCT: 60 %
Platelets: 322 10*3/uL (ref 150–400)
RBC: 3.85 MIL/uL — AB (ref 3.87–5.11)
RDW: 13.3 % (ref 11.5–15.5)
WBC: 6.5 10*3/uL (ref 4.0–10.5)

## 2016-05-27 LAB — COMPREHENSIVE METABOLIC PANEL
ALBUMIN: 3 g/dL — AB (ref 3.5–5.0)
ALT: 254 U/L — ABNORMAL HIGH (ref 14–54)
ANION GAP: 7 (ref 5–15)
AST: 62 U/L — AB (ref 15–41)
Alkaline Phosphatase: 169 U/L — ABNORMAL HIGH (ref 38–126)
BUN: 10 mg/dL (ref 6–20)
CHLORIDE: 104 mmol/L (ref 101–111)
CO2: 26 mmol/L (ref 22–32)
Calcium: 8.8 mg/dL — ABNORMAL LOW (ref 8.9–10.3)
Creatinine, Ser: 0.84 mg/dL (ref 0.44–1.00)
GFR calc Af Amer: >60 mL/min (ref >60)
Glucose, Bld: 102 mg/dL — ABNORMAL HIGH (ref 65–99)
POTASSIUM: 3.3 mmol/L — AB (ref 3.5–5.1)
Sodium: 137 mmol/L (ref 135–145)
Total Bilirubin: 1.5 mg/dL — ABNORMAL HIGH (ref 0.3–1.2)
Total Protein: 6.9 g/dL (ref 6.5–8.1)

## 2016-05-27 LAB — GLUCOSE, CAPILLARY: Glucose-Capillary: 81 mg/dL (ref 65–99)

## 2016-05-27 MED ORDER — POTASSIUM CHLORIDE CRYS ER 20 MEQ PO TBCR
20.0000 meq | EXTENDED_RELEASE_TABLET | Freq: Once | ORAL | Status: AC
  Administered 2016-05-27: 20 meq via ORAL
  Filled 2016-05-27: qty 1

## 2016-05-27 MED ORDER — HYDROCODONE-ACETAMINOPHEN 5-325 MG PO TABS: 1.0000 | ORAL_TABLET | ORAL | 0 refills | Status: AC | PRN

## 2016-05-27 NOTE — Progress Notes (Signed)
[facility] Progress Note  Subjective: Patient feels much better with improved abdominal pain, wants to eat to go home  Objective: Vital signs in last 24 hours: Temp:  [97.9 F (36.6 C)-99.2 F (37.3 C)] 97.9 F (36.6 C) (09/18 14780608) Pulse Rate:  [93-131] 93 (09/18 0608) Resp:  [18-29] 20 (09/18 0608) BP: (102-133)/(56-89) 120/81 (09/18 0608) SpO2:  [93 %-98 %] 98 % (09/18 0608) Weight:  [111.8 kg (246 lb 8 oz)] 111.8 kg (246 lb 8 oz) (09/18 [id number]) Weight change: -2.903 kg (-6 lb 6.4 oz)   PE: Abdomen soft  Lab Results: Results for orders placed or performed during the hospital encounter of 05/25/16 (from the past 24 hour(s))  Glucose, capillary     Status: None   Collection Time: 05/27/16  8:24 AM  Result Value Ref Range   Glucose-Capillary 81 65 - 99 mg/dL    Studies/Results: Dg Ercp Biliary & Pancreatic Ducts  Result Date: 05/26/2016 CLINICAL DATA:  25 year old female with possible choledocholithiasis EXAM: ERCP TECHNIQUE: Multiple spot images obtained with the fluoroscopic device and submitted for interpretation post-procedure. FLUOROSCOPY TIME:  Fluoroscopy Time:  6 minutes 12 seconds reported Number of Acquired Spot Images: 0 COMPARISON:  MRCP 05/24/2016; nuclear medicine HIDA scan 05/23/2016 FINDINGS: Three fluoroscopic saved images demonstrate a flexible endoscope in the descending duodenum and wire cannulation of the common bile ducts. Small filling defect in the distal common bile duct consistent with choledocholithiasis. There is slight extravasation of contrast material from the region of the cystic duct remanent. The biliary tree is not particularly dilated. IMPRESSION: 1. Choledocholithiasis. 2. Small bile leak emanating from the region of the cystic duct stump. These images were submitted for radiologic interpretation only. Please see the procedural report for the amount of contrast and the fluoroscopy time utilized. Electronically Signed   By: Malachy MoanHeath   McCullough M.D.   On: 05/26/2016 14:45   Dg Abd 2 Views  Result Date: 05/27/2016 CLINICAL DATA:  Migration of pancreatic stent. Initial encounter. Biliary leak. EXAM: ABDOMEN - 2 VIEW COMPARISON:  05/25/2016 FINDINGS: There is contrast within the colon. Cholecystectomy clips in the right upper quadrant. No evidence for free air on the upright view. Small linear radiopaque structure at the midline of the abdomen near the mid transverse colon. This may represent the pancreatic stent. This stent is oriented in a craniocaudal dimension which may represent migration. Nonobstructive bowel gas pattern. Small calcification in the region of the left kidney lower pole as was seen on the prior CT from 05/23/2016. IMPRESSION: Non metallic stent in the upper abdomen. This presumably represents the pancreatic stent and difficult to evaluate for malpositioning. Electronically Signed   By: Richarda OverlieAdam  Henn M.D.   On: 05/27/2016 08:58   Dg Abd 2 Views  Result Date: 05/25/2016 CLINICAL DATA:  Abdominal pain. Recent laparoscopic cholecystectomy. EXAM: ABDOMEN - 2 VIEW COMPARISON:  CTA 05/23/2016 FINDINGS: Bowel gas pattern is nonobstructive. Contrast within the colon from recent CT scan. Few air-filled nondilated small bowel loops in the mid abdomen. No free peritoneal air. No mass or mass effect. Mild degenerative change of the spine. IMPRESSION: Nonobstructive bowel gas pattern. Electronically Signed   By: Elberta Fortisaniel  Boyle M.D.   On: 05/25/2016 19:35      Assessment: Common bile duct stone status post ERCP and removal. Pancreatic stent location unclear from follow-up film.  Plan: Will advance diet, possibly home later today.    Lasean Gorniak C 05/27/2016, 10:30 AM  Pager [phone] If no answer or after 5 PM call  [phone] 
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
## 2016-05-27 NOTE — Progress Notes (Signed)
Pt discharged to home. Pt and s/o verbalize understanding of discharge instructions, all questions answered. IV discontinued before discharge. Belongings with patient

## 2016-05-27 NOTE — Care Management Note (Signed)
Case Management Note  Patient Details  Name: Augusto GambleJennifer Harwick MRN: [id number] Date of Birth: 07/13/1991  Subjective/Objective:                 Patient from [place], independent, admitted with choledocholithiasis. Will DC to home today, no needs at his time.   Action/Plan:  DC to home. Self care. Expected Discharge Date:                  Expected Discharge Plan:  Home/Self Care  In-House Referral:  NA  Discharge planning Services  CM Consult  Post Acute Care Choice:  NA Choice offered to:  NA  DME Arranged:  N/A DME Agency:  NA  HH Arranged:  NA HH Agency:  NA  Status of Service:  Completed, signed off  If discussed at Long Length of Stay Meetings, dates discussed:    Additional Comments:  Lawerance SabalDebbie Zell Hylton, RN 05/27/2016, 3:08 PM

## 2016-05-27 NOTE — Progress Notes (Signed)
Pt wanted vicodin for pain instead of Dilaudid. MD notified, order obtained and put in. Med given. Will continue to monitor.

## 2017-08-11 IMAGING — RF DG ERCP WO/W SPHINCTEROTOMY
1 series · 3 of 3 positions shown · non-contrast
Comparison: MRCP 05/24/2016; nuclear medicine HIDA scan 05/23/2016

CLINICAL DATA: 25-year-old female with possible choledocholithiasis

EXAM:
ERCP
TECHNIQUE: Multiple spot images obtained with the fluoroscopic device and
submitted for interpretation post-procedure.
FLUOROSCOPY TIME:  Fluoroscopy Time:  6 minutes 12 seconds reported
Number of Acquired Spot Images: 0

[Series 1: run · 3 of 3 slices shown]
[im 1/3]
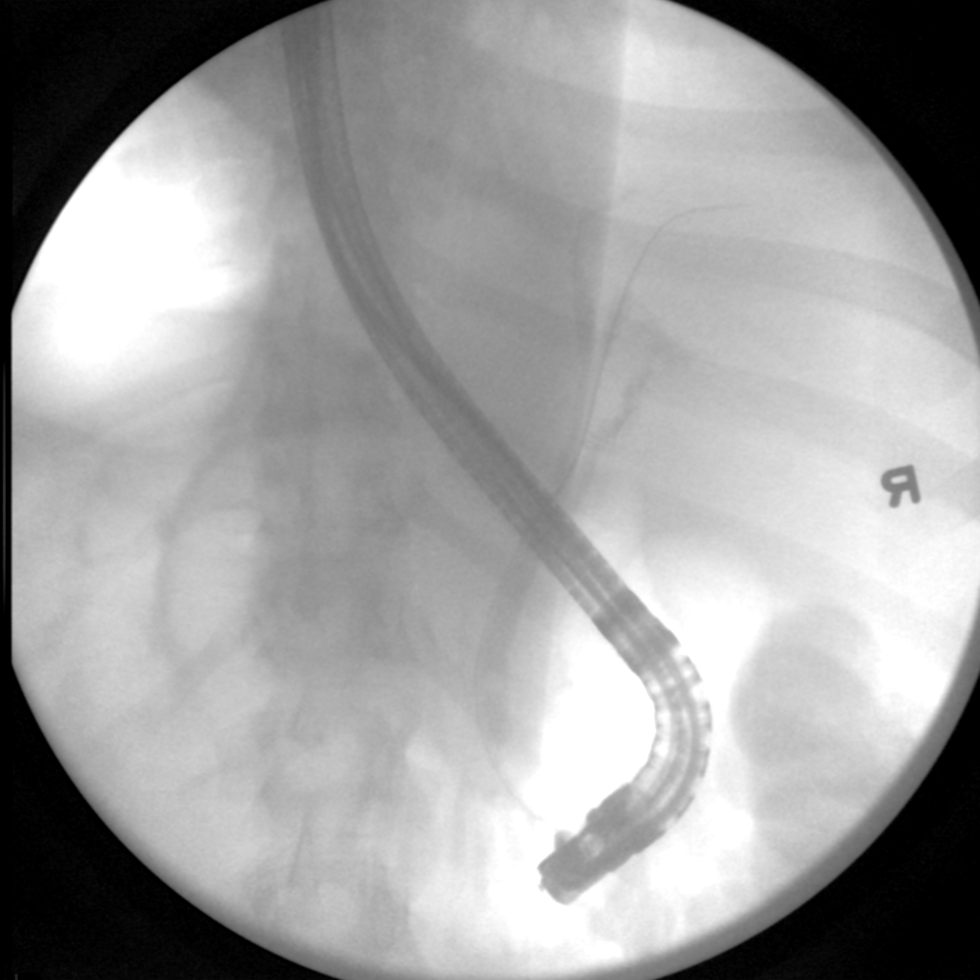
[im 2/3]
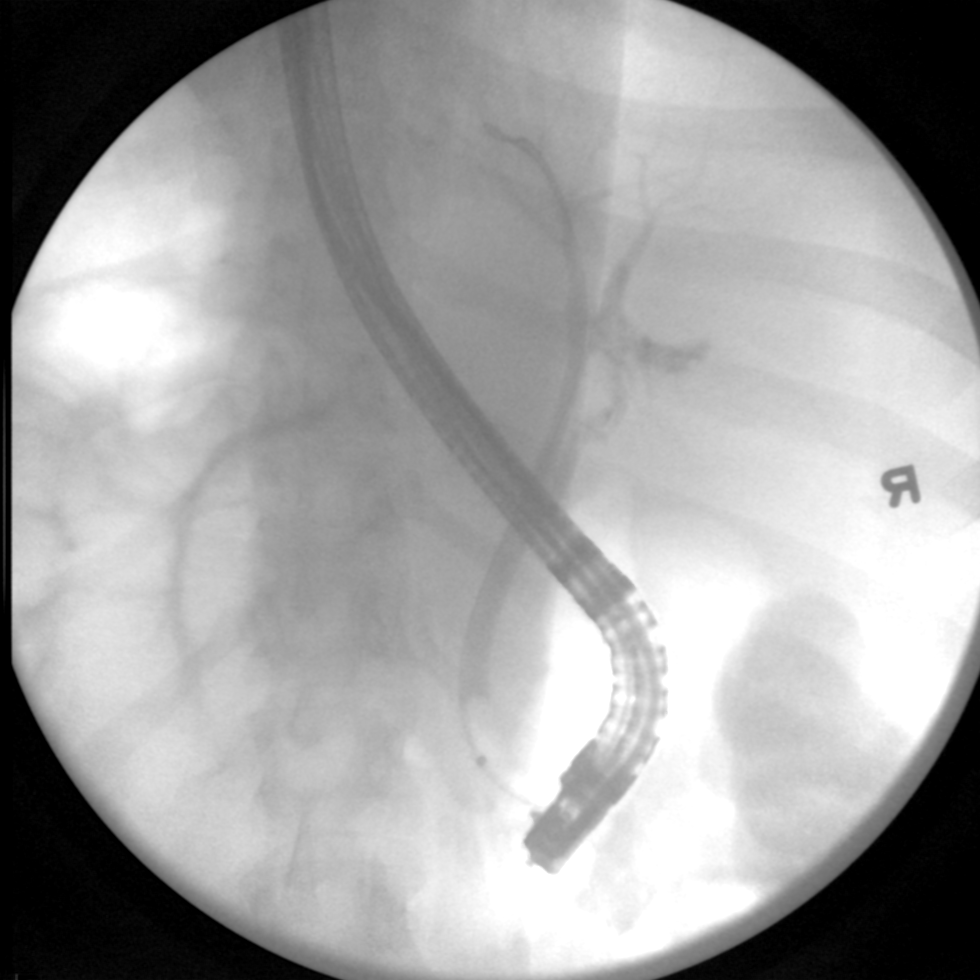
[im 3/3]
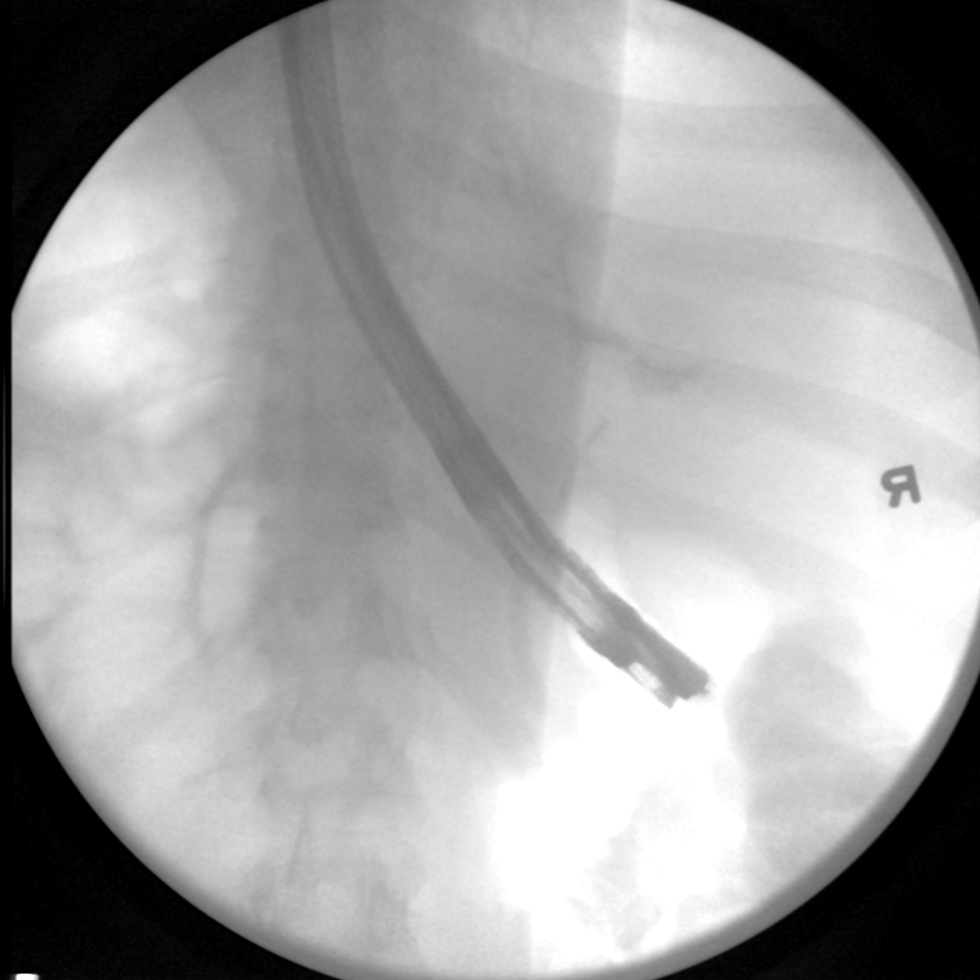

[3 of 3 positions shown; findings below may reference images not displayed]

FINDINGS: Three fluoroscopic saved images demonstrate a flexible endoscope in
the descending duodenum and wire cannulation of the common bile
ducts. Small filling defect in the distal common bile duct
consistent with choledocholithiasis. There is slight extravasation
of contrast material from the region of the cystic duct remanent.
The biliary tree is not particularly dilated.
IMPRESSION: 1. Choledocholithiasis.
2. Small bile leak emanating from the region of the cystic duct
stump.
These images were submitted for radiologic interpretation only.
Please see the procedural report for the amount of contrast and the
fluoroscopy time utilized.
